# Patient Record
Sex: Male | Born: 1985 | Race: Black or African American | Hispanic: No | Marital: Single | Smoking: Current some day smoker
Health system: Southern US, Community
[De-identification: ages and names within clinical notes are randomized; demographics above are authoritative.]

---

## 2019-09-13 ENCOUNTER — Emergency Department (HOSPITAL_COMMUNITY): Payer: Self-pay

## 2019-09-13 ENCOUNTER — Emergency Department (HOSPITAL_COMMUNITY)
Admission: EM | Admit: 2019-09-13 | Discharge: 2019-09-13 | Disposition: A | Discharge status: Home / Self Care | Payer: Self-pay | Attending: Emergency Medicine | Admitting: Emergency Medicine

## 2019-09-13 ENCOUNTER — Other Ambulatory Visit: Payer: Self-pay

## 2019-09-13 ENCOUNTER — Encounter (HOSPITAL_COMMUNITY): Payer: Self-pay | Admitting: Emergency Medicine

## 2019-09-13 DIAGNOSIS — Z202 Contact with and (suspected) exposure to infections with a predominantly sexual mode of transmission: Secondary | ICD-10-CM | POA: Insufficient documentation

## 2019-09-13 DIAGNOSIS — J9801 Acute bronchospasm: Secondary | ICD-10-CM | POA: Insufficient documentation

## 2019-09-13 LAB — HIV ANTIBODY (ROUTINE TESTING W REFLEX): HIV Screen 4th Generation wRfx: NONREACTIVE

## 2019-09-13 MED ORDER — LIDOCAINE HCL 1 % IJ SOLN
INTRAMUSCULAR | Status: AC
  Filled 2019-09-13: qty 20

## 2019-09-13 MED ORDER — DOXYCYCLINE HYCLATE 100 MG PO TABS
100.0000 mg | ORAL_TABLET | Freq: Once | ORAL | Status: AC
  Administered 2019-09-13: 100 mg via ORAL
  Filled 2019-09-13: qty 1

## 2019-09-13 MED ORDER — DOXYCYCLINE HYCLATE 100 MG PO CAPS: 100.0000 mg | ORAL_CAPSULE | Freq: Two times a day (BID) | ORAL | 0 refills | Status: AC

## 2019-09-13 MED ORDER — ALBUTEROL SULFATE HFA 108 (90 BASE) MCG/ACT IN AERS: 1.0000 | INHALATION_SPRAY | Freq: Four times a day (QID) | RESPIRATORY_TRACT | 0 refills | Status: AC | PRN

## 2019-09-13 MED ORDER — CEFTRIAXONE SODIUM 1 G IJ SOLR
500.0000 mg | Freq: Once | INTRAMUSCULAR | Status: AC
  Administered 2019-09-13: 500 mg via INTRAMUSCULAR
  Filled 2019-09-13: qty 10

## 2019-09-13 NOTE — Discharge Instructions (Addendum)
Do not have sex for 2 weeks Have all partners tested and treated If your test is abnormal, you will be called but you have been treated for Gonorrhea and Chlamydia today. You can also review your results on MyChart Practice safe sex and use a condom to prevent infection or unwanted pregnancy Follow up with the Health Department  Please use the albuterol inhaler as needed for wheezing. Return to the ER if your symptoms worsen.  I have provided a coupon through [facility] which you may take to [facility] to fill your prescription.  Also please make sure to establish with a family doctor.  You may do this if you get insurance by calling the phone number in your discharge paperwork to establish with 1.  I have also provided the follow-up information for [facility] which is a free clinic.  He will need to see a family doctor and possibly be evaluated for sleep apnea.

## 2019-09-13 NOTE — ED Provider Notes (Signed)
[name] Provider Note   CSN: [id number] Arrival date & time: 09/13/19  1015     History Chief Complaint  Patient presents with  . Wheezing  . Nasal Congestion    Joseph Levy is a 34 y.o. male.  HPI 34 year old with no significant medical history presents to the ER with wheezing when at work.  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  He started working there approximately 5 months ago, but has developed wheezing when at work over the last 3 days.  No cough, fevers, chills, headache, weakness, abdominal pain, nausea, vomiting, dizziness.  He states that his friend reported that he sometimes stops breathing when he sleeping.  He also presents with request of STD test as he has unprotected sex with a partner who went out of town this weekend and states that he does not trust her.  He denies any dysuria, development of lesions, penile discharge, scrotal pain.  He states that he does not have a PCP, and is in the process of getting health insurance from his work.    History reviewed. No pertinent past medical history.  There are no problems to display for this patient.   History reviewed. No pertinent surgical history.     No family history on file.  Social History   Tobacco Use  . Smoking status: Not on file  Substance Use Topics  . Alcohol use: Not on file  . Drug use: Not on file    Home Medications Prior to Admission medications   Medication Sig Start Date End Date Taking? Authorizing Provider  albuterol (VENTOLIN HFA) 108 (90 Base) MCG/ACT inhaler Inhale 1-2 puffs into the lungs every 6 (six) hours as needed for wheezing or shortness of breath. 09/13/19   Mare Ferrari, PA-C  doxycycline (VIBRAMYCIN) 100 MG capsule Take 1 capsule (100 mg total) by mouth 2 (two) times daily for 7 days. 09/13/19 09/20/19  Mare Ferrari, PA-C    Allergies    Patient has no known allergies.  Review of  Systems   Review of Systems  Constitutional: Negative for chills and fever.  Respiratory: Positive for wheezing. Negative for cough.   Gastrointestinal: Negative for diarrhea, nausea and vomiting.  Genitourinary: Negative for decreased urine volume, difficulty urinating, discharge, dysuria, genital sores, penile pain, penile swelling, scrotal swelling, testicular pain and urgency.  Neurological: Negative for dizziness and headaches.    Physical Exam Updated Vital Signs BP (!) 141/83   Pulse 78   Temp 98.4 F (36.9 C)   Resp 16   SpO2 99%   Physical Exam Vitals and nursing note reviewed.  Constitutional:      General: He is not in acute distress.    Appearance: Normal appearance. He is well-developed. He is not ill-appearing, toxic-appearing or diaphoretic.  HENT:     Head: Normocephalic and atraumatic.     Nose: Nose normal.     Mouth/Throat:     Mouth: Mucous membranes are moist.     Pharynx: Oropharynx is clear.  Eyes:     Conjunctiva/sclera: Conjunctivae normal.     Pupils: Pupils are equal, round, and reactive to light.  Cardiovascular:     Rate and Rhythm: Normal rate and regular rhythm.     Pulses: Normal pulses.     Heart sounds: Normal heart sounds. No murmur.  Pulmonary:     Effort: Pulmonary effort is normal. No respiratory distress.  Breath sounds: Normal breath sounds. No stridor. No wheezing ( ) or rhonchi.     Comments: Lungs clear, no wheezes Abdominal:     General: Abdomen is flat.     Palpations: Abdomen is soft.     Tenderness: There is no abdominal tenderness.  Musculoskeletal:        General: No swelling, tenderness, deformity or signs of injury. Normal range of motion.     Cervical back: Normal range of motion and neck supple.     Right lower leg: No edema.     Left lower leg: No edema.  Skin:    General: Skin is warm and dry.     Capillary Refill: Capillary refill takes less than 2 seconds.     Findings: No erythema or rash.    Neurological:     General: No focal deficit present.     Mental Status: He is alert and oriented to person, place, and time.     Sensory: No sensory deficit.     Motor: No weakness.  Psychiatric:        Mood and Affect: Mood normal.        Behavior: Behavior normal.     ED Results / Procedures / Treatments   Labs (all labs ordered are listed, but only abnormal results are displayed) Labs Reviewed  HIV ANTIBODY (ROUTINE TESTING W REFLEX)  GC/CHLAMYDIA PROBE AMP ([place]) NOT AT [facility]    EKG None  Radiology DG Chest Portable 1 View  Result Date: 09/13/2019 CLINICAL DATA:  Wheezing, works around gas and fumes, nasal congestion for 2 weeks EXAM: PORTABLE CHEST 1 VIEW COMPARISON:  Portable exam 1059 hours without priors for comparison FINDINGS: Normal heart size, mediastinal contours, and pulmonary vascularity. Lungs clear. No pleural effusion or pneumothorax. Bones unremarkable. IMPRESSION: No acute abnormalities. Electronically Signed   By: Ulyses Southward M.D.   On: 09/13/2019 11:27    Procedures Procedures (including critical care time)  Medications Ordered in ED Medications  cefTRIAXone (ROCEPHIN) injection 500 mg (500 mg Intramuscular Given 09/13/19 1112)  doxycycline (VIBRA-TABS) tablet 100 mg (100 mg Oral Given 09/13/19 1113)  lidocaine (XYLOCAINE) 1 % (with pres) injection (  Given 09/13/19 1113)    ED Course  I have reviewed the triage vital signs and the nursing notes.  Pertinent labs & imaging results that were available during my care of the patient were reviewed by me and considered in my medical decision making (see chart for details).    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 On presentation to the ER, the patient is alert and oriented, nontoxic-appearing, no acute distress, without evidence of increased work of breathing or audible wheezing.  Vitals are overall reassuring, sats  99%.  Physical exam benign, no wheezes heard on exam.  He denies any headache, dizziness, nausea, vomiting, or any signs of carbon monoxide poisoning.  Chest x-ray without acute abnormalities.  GC chlamydia and HIV ordered per patient request, though he denies any symptoms.  Patient requested to be prophylactically treated in the ED.  He was given Rocephin, doxycycline, will send home with prescription of doxycycline.  Will prescribe albuterol for as needed use.  Patient was instructed to not have sexual intercourse for 2 weeks, have all partners tested.  Return precautions given.  I also discussed that he needs to establish with a  PCP either when he gets insurance, or through [facility] and wellness which I provided contact information for.  He likely would benefit from a sleep study to evaluate for sleep apnea.  I explained this to the patient and he voiced understanding.  Overall work-up reassuring.  At this stage in the ED course, the patient is adequately medically screened and is stable for discharge.   Final Clinical Impression(s) / ED Diagnoses Final diagnoses:  STD exposure  Bronchospasm    Rx / DC Orders ED Discharge Orders         Ordered    doxycycline (VIBRAMYCIN) 100 MG capsule  2 times daily     09/13/19 1051    albuterol (VENTOLIN HFA) 108 (90 Base) MCG/ACT inhaler  Every 6 hours PRN     09/13/19 1052           Lyndel Safe 09/13/19 1144    Lacretia Leigh, MD 09/13/19 1426

## 2019-09-13 NOTE — ED Triage Notes (Signed)
Pt works at [employer] around gas and fumes and having to get moved due to causing wheezing. Having nasal congestion x 2 weeks.

## 2019-09-14 LAB — GC/CHLAMYDIA PROBE AMP (~~LOC~~) NOT AT ARMC
Chlamydia: POSITIVE — AB
Comment: NEGATIVE
Comment: NORMAL
Neisseria Gonorrhea: NEGATIVE

## 2019-10-26 ENCOUNTER — Emergency Department (HOSPITAL_COMMUNITY): Admission: EM | Admit: 2019-10-26 | Discharge: 2019-10-26 | Discharge status: Against Medical Advice | Payer: Self-pay

## 2019-10-26 ENCOUNTER — Other Ambulatory Visit: Payer: Self-pay

## 2019-10-26 NOTE — ED Notes (Signed)
Called once in lobby and outside no response

## 2019-10-26 NOTE — ED Notes (Signed)
Called for triage x 3 with no answer. 

## 2019-10-28 ENCOUNTER — Other Ambulatory Visit: Payer: Self-pay

## 2019-10-28 ENCOUNTER — Emergency Department (HOSPITAL_COMMUNITY)
Admission: EM | Admit: 2019-10-28 | Discharge: 2019-10-28 | Disposition: A | Discharge status: Home / Self Care | Payer: Self-pay | Attending: Emergency Medicine | Admitting: Emergency Medicine

## 2019-10-28 ENCOUNTER — Encounter (HOSPITAL_COMMUNITY): Payer: Self-pay | Admitting: Emergency Medicine

## 2019-10-28 DIAGNOSIS — F17210 Nicotine dependence, cigarettes, uncomplicated: Secondary | ICD-10-CM | POA: Insufficient documentation

## 2019-10-28 DIAGNOSIS — Z113 Encounter for screening for infections with a predominantly sexual mode of transmission: Secondary | ICD-10-CM

## 2019-10-28 DIAGNOSIS — Z202 Contact with and (suspected) exposure to infections with a predominantly sexual mode of transmission: Secondary | ICD-10-CM | POA: Insufficient documentation

## 2019-10-28 LAB — URINALYSIS, ROUTINE W REFLEX MICROSCOPIC
Bilirubin Urine: NEGATIVE
Glucose, UA: NEGATIVE mg/dL
Ketones, ur: NEGATIVE mg/dL
Leukocytes,Ua: NEGATIVE
Nitrite: NEGATIVE
Protein, ur: NEGATIVE mg/dL
Specific Gravity, Urine: 1.024 (ref 1.005–1.030)
pH: 6 (ref 5.0–8.0)

## 2019-10-28 LAB — HIV ANTIBODY (ROUTINE TESTING W REFLEX): HIV Screen 4th Generation wRfx: NONREACTIVE

## 2019-10-28 NOTE — ED Provider Notes (Signed)
[facility]-EMER[facility] DEPT Provider Note   CSN: [id number] Arrival date & time: 10/28/19  1243    History Chief Complaint  Patient presents with  . STD Check-up    Joseph Levy is a 34 y.o. male with past medical history who presents for evaluation requesting STD screen.  Patient states "I still make sure and clean."  States he intermittently uses protection.  Sexually active with females.  No fever, chills, nausea, vomiting, abdominal pain, pain with bowel movements, testicular pain, redness, swelling, rashes, lesions, hematuria, dysuria, penile discharge.  Denies additional aggravating relieving factors. Pain 0/10  History obtained from patient and past medical records. No interpretor was used.  HPI     History reviewed. No pertinent past medical history.  There are no problems to display for this patient.   History reviewed. No pertinent surgical history.     History reviewed. No pertinent family history.  Social History   Tobacco Use  . Smoking status: Current Some Day Smoker    Types: Cigarettes  . Smokeless tobacco: Never Used  Substance Use Topics  . Alcohol use: Yes    Alcohol/week: 2.0 standard drinks    Types: 2 Glasses of wine per week  . Drug use: Never    Home Medications Prior to Admission medications   Medication Sig Start Date End Date Taking? Authorizing Provider  albuterol (VENTOLIN HFA) 108 (90 Base) MCG/ACT inhaler Inhale 1-2 puffs into the lungs every 6 (six) hours as needed for wheezing or shortness of breath. 09/13/19   Mare Ferrari, PA-C    Allergies    Patient has no known allergies.  Review of Systems   Review of Systems  Constitutional: Negative.   HENT: Negative.   Respiratory: Negative.   Cardiovascular: Negative.   Gastrointestinal: Negative.   Genitourinary: Negative.   Musculoskeletal: Negative.   Skin: Negative.   Neurological: Negative.   All other systems reviewed and are negative.   Physical  Exam Updated Vital Signs BP (!) 139/93 (BP Location: Left Arm)   Pulse 86   Temp 98.1 F (36.7 C) (Oral)   Resp 15   Ht 6' (1.829 m)   Wt (!) 95.3 kg   SpO2 100%   BMI 28.48 kg/m   Physical Exam Vitals and nursing note reviewed.  Constitutional:      General: He is not in acute distress.    Appearance: He is well-developed. He is not ill-appearing, toxic-appearing or diaphoretic.  HENT:     Head: Normocephalic and atraumatic.     Nose: Nose normal.     Mouth/Throat:     Mouth: Mucous membranes are moist.  Eyes:     Pupils: Pupils are equal, round, and reactive to light.  Cardiovascular:     Rate and Rhythm: Normal rate and regular rhythm.     Pulses: Normal pulses.     Heart sounds: Normal heart sounds.  Pulmonary:     Effort: Pulmonary effort is normal. No respiratory distress.     Breath sounds: Normal breath sounds.  Abdominal:     General: Bowel sounds are normal. There is no distension.     Palpations: Abdomen is soft.  Genitourinary:    Comments: Declined GU exam Musculoskeletal:        General: Normal range of motion.     Cervical back: Normal range of motion and neck supple.  Skin:    General: Skin is warm and dry.  Neurological:     Mental Status: He is  alert.    ED Results / Procedures / Treatments   Labs (all labs ordered are listed, but only abnormal results are displayed) Labs Reviewed  RPR  URINALYSIS, ROUTINE W REFLEX MICROSCOPIC  HIV ANTIBODY (ROUTINE TESTING W REFLEX)  GC/CHLAMYDIA PROBE AMP ([place]) NOT AT [facility]    EKG None  Radiology No results found.  Procedures Procedures (including critical care time)  Medications Ordered in ED Medications - No data to display  ED Course  I have reviewed the triage vital signs and the nursing notes.  Pertinent labs & imaging results that were available during my care of the patient were reviewed by me and considered in my medical decision making (see chart for details).  34 year old  presents for evaluation of requesting STD screen.  No known exposures.  He denies any current symptoms.  Is not PCP follow-up.  Urinalysis obtained as well as blood work. He will be called with results.  Patient is afebrile without abdominal tenderness, abdominal pain or painful bowel movements to indicate prostatitis.  Declined GUY exam. Given without syptoms I feel this is reasonable. STD cultures obtained including HIV, syphilis, gonorrhea and chlamydia. Patient to be discharged with instructions to follow up with PCP. Discussed importance of using protection when sexually active. Pt understands that they have GC/Chlamydia cultures pending and that they will need to inform all sexual partners if results return positive. Hold on Abx until STD testing results.  The patient has been appropriately medically screened and/or stabilized in the ED. I have low suspicion for any other emergent medical condition which would require further screening, evaluation or treatment in the ED or require inpatient management.  Patient is hemodynamically stable and in no acute distress.  Patient able to ambulate in department prior to ED.  Evaluation does not show acute pathology that would require ongoing or additional emergent interventions while in the emergency department or further inpatient treatment.  I have discussed the diagnosis with the patient and answered all questions.  Pain is been managed while in the emergency department and patient has no further complaints prior to discharge.  Patient is comfortable with plan discussed in room and is stable for discharge at this time.  I have discussed strict return precautions for returning to the emergency department.  Patient was encouraged to follow-up with PCP/specialist refer to at discharge.    MDM Rules/Calculators/A&P                          Final Clinical Impression(s) / ED Diagnoses Final diagnoses:  Screening examination for STD (sexually transmitted  disease)    Rx / DC Orders ED Discharge Orders    None       Nahal Wanless A, PA-C 10/28/19 1511    Geoffery Lyons, MD 10/28/19 1513

## 2019-10-28 NOTE — ED Triage Notes (Signed)
Patient arrived by self from home. Patient states they want STD screening. Patient denies dysuria or any other complaints.   Patient denies pain.

## 2019-10-28 NOTE — Discharge Instructions (Signed)
You will be called if your results are positive.  If positive you will need follow up

## 2019-10-28 NOTE — ED Notes (Signed)
Pt verbalizes understanding of DC instructions. Pt belongings returned and is ambulatory out of ED.   Signature pad not available  

## 2019-10-29 LAB — RPR: RPR Ser Ql: NONREACTIVE

## 2020-10-09 IMAGING — DX DG CHEST 1V PORT
1 series · 1 of 1 positions shown · non-contrast
Comparison: Portable exam 7784 hours without priors for comparison

CLINICAL DATA: Wheezing, works around gas and fumes, nasal
congestion for 2 weeks

EXAM:
PORTABLE CHEST 1 VIEW

[chest ap]
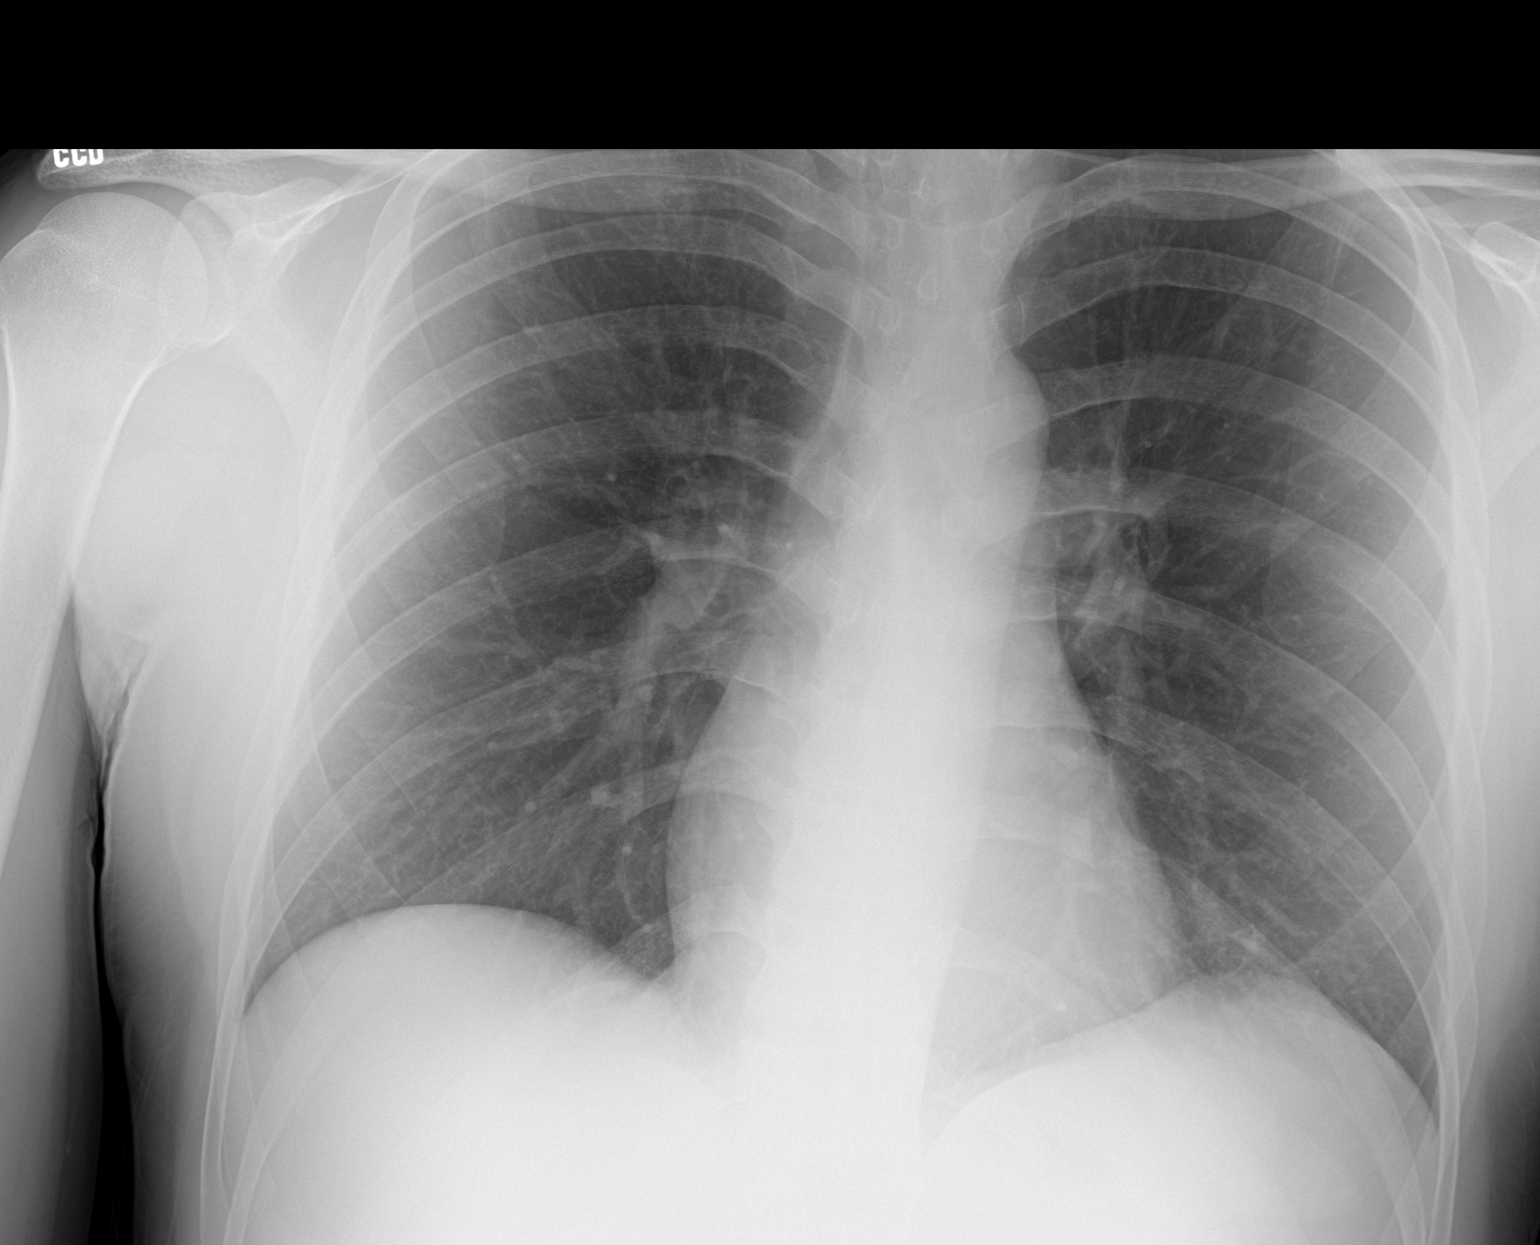

[1 of 1 positions shown; findings below may reference images not displayed]

FINDINGS: Normal heart size, mediastinal contours, and pulmonary vascularity.

Lungs clear.

No pleural effusion or pneumothorax.

Bones unremarkable.
IMPRESSION: No acute abnormalities.
